# Patient Record
Sex: Male | Born: 2000 | Race: White | Hispanic: No | Marital: Single | Smoking: Current every day smoker
Health system: Southern US, Community
[De-identification: ages and names within clinical notes are randomized; demographics above are authoritative.]

---
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
## 2019-04-27 ENCOUNTER — Encounter: Payer: Self-pay | Admitting: Emergency Medicine

## 2019-04-27 ENCOUNTER — Emergency Department: Payer: 59

## 2019-04-27 ENCOUNTER — Other Ambulatory Visit: Payer: Self-pay

## 2019-04-27 ENCOUNTER — Observation Stay
Admission: EM | Admit: 2019-04-27 | Discharge: 2019-04-28 | Disposition: A | Discharge status: Home / Self Care | Payer: 59 | Attending: General Surgery | Admitting: General Surgery

## 2019-04-27 DIAGNOSIS — K561 Intussusception: Secondary | ICD-10-CM | POA: Diagnosis present

## 2019-04-27 DIAGNOSIS — Z880 Allergy status to penicillin: Secondary | ICD-10-CM | POA: Diagnosis not present

## 2019-04-27 DIAGNOSIS — Z20828 Contact with and (suspected) exposure to other viral communicable diseases: Secondary | ICD-10-CM | POA: Insufficient documentation

## 2019-04-27 DIAGNOSIS — Z7151 Drug abuse counseling and surveillance of drug abuser: Secondary | ICD-10-CM | POA: Diagnosis not present

## 2019-04-27 DIAGNOSIS — F17290 Nicotine dependence, other tobacco product, uncomplicated: Secondary | ICD-10-CM | POA: Insufficient documentation

## 2019-04-27 DIAGNOSIS — E876 Hypokalemia: Secondary | ICD-10-CM | POA: Insufficient documentation

## 2019-04-27 DIAGNOSIS — E860 Dehydration: Secondary | ICD-10-CM | POA: Diagnosis not present

## 2019-04-27 DIAGNOSIS — R112 Nausea with vomiting, unspecified: Secondary | ICD-10-CM | POA: Diagnosis not present

## 2019-04-27 DIAGNOSIS — F12188 Cannabis abuse with other cannabis-induced disorder: Secondary | ICD-10-CM | POA: Diagnosis not present

## 2019-04-27 DIAGNOSIS — R109 Unspecified abdominal pain: Secondary | ICD-10-CM | POA: Diagnosis present

## 2019-04-27 LAB — URINALYSIS, COMPLETE (UACMP) WITH MICROSCOPIC
Bacteria, UA: NONE SEEN
Bilirubin Urine: NEGATIVE
Glucose, UA: NEGATIVE mg/dL
Hgb urine dipstick: NEGATIVE
Ketones, ur: NEGATIVE mg/dL
Leukocytes,Ua: NEGATIVE
Nitrite: NEGATIVE
Protein, ur: 30 mg/dL — AB
Specific Gravity, Urine: 1.027 (ref 1.005–1.030)
pH: 7 (ref 5.0–8.0)

## 2019-04-27 LAB — LIPASE, BLOOD: Lipase: 17 U/L (ref 11–51)

## 2019-04-27 LAB — COMPREHENSIVE METABOLIC PANEL
ALT: 20 U/L (ref 0–44)
AST: 26 U/L (ref 15–41)
Albumin: 5.4 g/dL — ABNORMAL HIGH (ref 3.5–5.0)
Alkaline Phosphatase: 61 U/L (ref 38–126)
Anion gap: 16 — ABNORMAL HIGH (ref 5–15)
BUN: 21 mg/dL — ABNORMAL HIGH (ref 6–20)
CO2: 30 mmol/L (ref 22–32)
Calcium: 10.3 mg/dL (ref 8.9–10.3)
Chloride: 90 mmol/L — ABNORMAL LOW (ref 98–111)
Creatinine, Ser: 0.81 mg/dL (ref 0.61–1.24)
GFR calc Af Amer: >60 mL/min (ref >60)
GFR calc non Af Amer: >60 mL/min (ref >60)
Glucose, Bld: 128 mg/dL — ABNORMAL HIGH (ref 70–99)
Potassium: 2.9 mmol/L — ABNORMAL LOW (ref 3.5–5.1)
Sodium: 136 mmol/L (ref 135–145)
Total Bilirubin: 1.9 mg/dL — ABNORMAL HIGH (ref 0.3–1.2)
Total Protein: 8.9 g/dL — ABNORMAL HIGH (ref 6.5–8.1)

## 2019-04-27 LAB — URINE DRUG SCREEN, QUALITATIVE (ARMC ONLY)
Amphetamines, Ur Screen: NOT DETECTED
Barbiturates, Ur Screen: NOT DETECTED
Benzodiazepine, Ur Scrn: NOT DETECTED
Cannabinoid 50 Ng, Ur ~~LOC~~: POSITIVE — AB
Cocaine Metabolite,Ur ~~LOC~~: NOT DETECTED
MDMA (Ecstasy)Ur Screen: NOT DETECTED
Methadone Scn, Ur: NOT DETECTED
Opiate, Ur Screen: NOT DETECTED
Phencyclidine (PCP) Ur S: NOT DETECTED
Tricyclic, Ur Screen: NOT DETECTED

## 2019-04-27 LAB — CBC
HCT: 54.4 % — ABNORMAL HIGH (ref 39.0–52.0)
Hemoglobin: 19.4 g/dL — ABNORMAL HIGH (ref 13.0–17.0)
MCH: 29.3 pg (ref 26.0–34.0)
MCHC: 35.7 g/dL (ref 30.0–36.0)
MCV: 82.2 fL (ref 80.0–100.0)
Platelets: 334 10*3/uL (ref 150–400)
RBC: 6.62 MIL/uL — ABNORMAL HIGH (ref 4.22–5.81)
RDW: 11.6 % (ref 11.5–15.5)
WBC: 19.6 10*3/uL — ABNORMAL HIGH (ref 4.0–10.5)
nRBC: 0 % (ref 0.0–0.2)

## 2019-04-27 LAB — LACTIC ACID, PLASMA: Lactic Acid, Venous: 1.6 mmol/L (ref 0.5–1.9)

## 2019-04-27 LAB — MAGNESIUM: Magnesium: 2.4 mg/dL (ref 1.7–2.4)

## 2019-04-27 MED ORDER — SODIUM CHLORIDE 0.9 % IV BOLUS: 1000.0000 mL | Freq: Once | INTRAVENOUS | Status: DC

## 2019-04-27 MED ORDER — ONDANSETRON HCL 4 MG/2ML IJ SOLN
4.0000 mg | Freq: Four times a day (QID) | INTRAMUSCULAR | Status: DC | PRN
  Administered 2019-04-28 (×2): 4 mg via INTRAVENOUS
  Filled 2019-04-27 (×2): qty 2

## 2019-04-27 MED ORDER — HYDROMORPHONE HCL 1 MG/ML IJ SOLN: 0.5000 mg | INTRAMUSCULAR | Status: DC | PRN

## 2019-04-27 MED ORDER — SODIUM CHLORIDE 0.9 % IV BOLUS
1000.0000 mL | Freq: Once | INTRAVENOUS | Status: AC
  Administered 2019-04-27: 1000 mL via INTRAVENOUS

## 2019-04-27 MED ORDER — ONDANSETRON 4 MG PO TBDP
4.0000 mg | ORAL_TABLET | Freq: Four times a day (QID) | ORAL | Status: DC | PRN
  Filled 2019-04-27: qty 1

## 2019-04-27 MED ORDER — IOHEXOL 300 MG/ML  SOLN
100.0000 mL | Freq: Once | INTRAMUSCULAR | Status: AC | PRN
  Administered 2019-04-27: 100 mL via INTRAVENOUS
  Filled 2019-04-27: qty 100

## 2019-04-27 MED ORDER — KCL IN DEXTROSE-NACL 40-5-0.45 MEQ/L-%-% IV SOLN
INTRAVENOUS | Status: DC
  Administered 2019-04-27 – 2019-04-28 (×2): via INTRAVENOUS
  Filled 2019-04-27 (×4): qty 1000

## 2019-04-27 MED ORDER — ONDANSETRON HCL 4 MG/2ML IJ SOLN
4.0000 mg | Freq: Once | INTRAMUSCULAR | Status: AC | PRN
  Administered 2019-04-27: 4 mg via INTRAVENOUS
  Filled 2019-04-27: qty 2

## 2019-04-27 MED ORDER — IOHEXOL 9 MG/ML PO SOLN
500.0000 mL | ORAL | Status: AC
  Filled 2019-04-27 (×2): qty 500

## 2019-04-27 MED ORDER — POTASSIUM CHLORIDE 10 MEQ/100ML IV SOLN
10.0000 meq | INTRAVENOUS | Status: AC
  Filled 2019-04-27 (×2): qty 100

## 2019-04-27 MED ORDER — KETOROLAC TROMETHAMINE 30 MG/ML IJ SOLN
30.0000 mg | Freq: Four times a day (QID) | INTRAMUSCULAR | Status: DC | PRN
  Administered 2019-04-28: 10:00:00 30 mg via INTRAVENOUS
  Filled 2019-04-27: qty 1

## 2019-04-27 MED ORDER — SODIUM CHLORIDE 0.9% FLUSH: 3.0000 mL | Freq: Once | INTRAVENOUS | Status: DC

## 2019-04-27 NOTE — ED Notes (Signed)
See triage note  Presents with n/v for the past 3 days     Last time vomited was about 1 hours PTA  Denies any diarrhea or fever  Afebrile on arrival

## 2019-04-27 NOTE — ED Notes (Signed)
Pt ambulatory to restroom, gait steafdy

## 2019-04-27 NOTE — ED Triage Notes (Signed)
Pt states he waited too long to eat on Sunday  became nauseated and began throwing up, hasn't been able to keep anything down, sent here from UC for dehydration, has been seen here for the same about a year ago. NAD.

## 2019-04-27 NOTE — ED Provider Notes (Signed)
[facility] Emergency Department Provider Note  ____________________________________________  Time seen: Approximately 6:19 PM  I have reviewed the triage vital signs and the nursing notes.   HISTORY  Chief Complaint Emesis    HPI Eric Reid is a 18 y.o. male who presents the emergency department complaining of ongoing nausea and vomiting x3 days.  Patient reports that he did not eat much on Sunday, started feeling shaky and nauseated when he began to eat again nauseated began to throw up.  Patient reports his nausea and vomiting persisted and he has had limited intake of solids and liquids for the past 3 days.  Patient states that initially his only symptoms were nausea and vomiting but now includes diffuse abdominal pain which she describes as an ache from vomiting.  He also reports that his throat is now sore from emesis.  He did not have any fevers or chills, nasal congestion, sore throat, cough, abdominal pain prior to the onset of nausea and vomiting.  No diarrhea.  Patient is here requesting IV fluids and medications for nausea.  Patient has had no hematic emesis.  No constipation, urinary symptoms.         History reviewed. No pertinent past medical history.  Patient Active Problem List   Diagnosis Date Noted  . Abdominal pain 04/27/2019    History reviewed. No pertinent surgical history.  Prior to Admission medications   Medication Sig Start Date End Date Taking? Authorizing Provider  ondansetron (ZOFRAN ODT) 4 MG disintegrating tablet Take 1 tablet (4 mg total) by mouth every 8 (eight) hours as needed for nausea or vomiting. Patient not taking: Reported on 04/27/2019 09/13/18   Willy Eddyobinson, Patrick, MD  Poison Ivy Treatments (ZANFEL) MISC Use daily as needed. Patient not taking: Reported on 04/27/2019 09/25/14   Linna HoffKindl, James D, MD    Allergies Penicillins  No family history on file.  Social History Social History   Tobacco Use  .  Smoking status: Current Every Day Smoker  . Smokeless tobacco: Never Used  Substance Use Topics  . Alcohol use: No  . Drug use: Not on file     Review of Systems  Constitutional: No fever/chills Eyes: No visual changes. No discharge ENT: Reported sore throat from "the amount of vomiting." Cardiovascular: no chest pain. Respiratory: no cough. No SOB. Gastrointestinal: Diffuse abdominal pain described as aching from vomiting..  Ongoing nausea and emesis..  No diarrhea.  No constipation. Genitourinary: Negative for dysuria. No hematuria Musculoskeletal: Negative for musculoskeletal pain. Skin: Negative for rash, abrasions, lacerations, ecchymosis. Neurological: Negative for headaches, focal weakness or numbness. 10-point ROS otherwise negative.  ____________________________________________   PHYSICAL EXAM:  VITAL SIGNS: ED Triage Vitals  Enc Vitals Group     BP 04/27/19 1726 (!) 141/82     Pulse Rate 04/27/19 1726 99     Resp 04/27/19 1726 16     Temp 04/27/19 1726 98 F (36.7 C)     Temp Source 04/27/19 1726 Oral     SpO2 04/27/19 1726 100 %     Weight 04/27/19 1727 145 lb (65.8 kg)     Height 04/27/19 1727 5\' 10"  (1.778 m)     Head Circumference --      Peak Flow --      Pain Score 04/27/19 1726 7     Pain Loc --      Pain Edu? --      Excl. in GC? --      Constitutional: Alert and  oriented. Well appearing and in no acute distress. Eyes: Conjunctivae are normal. PERRL. EOMI. Head: Atraumatic. ENT:      Ears:       Nose: No congestion/rhinnorhea.      Mouth/Throat: Mucous membranes are moist.  Oropharynx is nonerythematous and nonedematous.  Uvula is midline. Neck: No stridor.  Neck is supple full range of motion Hematological/Lymphatic/Immunilogical: No cervical lymphadenopathy. Cardiovascular: Normal rate, regular rhythm. Normal S1 and S2.  Good peripheral circulation. Respiratory: Normal respiratory effort without tachypnea or retractions. Lungs CTAB. Good  air entry to the bases with no decreased or absent breath sounds. Gastrointestinal: No visible external abdominal wall findings.  Bowel sounds 4 quadrants.  Bowel sounds are slightly hyperactive throughout abdomen.. Soft and nontender to palpation. No guarding or rigidity. No palpable masses. No distention. No CVA tenderness. Musculoskeletal: Full range of motion to all extremities. No gross deformities appreciated. Neurologic:  Normal speech and language. No gross focal neurologic deficits are appreciated.  Skin:  Skin is warm, dry and intact. No rash noted. Psychiatric: Mood and affect are normal. Speech and behavior are normal. Patient exhibits appropriate insight and judgement.   ____________________________________________   LABS (all labs ordered are listed, but only abnormal results are displayed)  Labs Reviewed  COMPREHENSIVE METABOLIC PANEL - Abnormal; Notable for the following components:      Result Value   Potassium 2.9 (*)    Chloride 90 (*)    Glucose, Bld 128 (*)    BUN 21 (*)    Total Protein 8.9 (*)    Albumin 5.4 (*)    Total Bilirubin 1.9 (*)    Anion gap 16 (*)    All other components within normal limits  CBC - Abnormal; Notable for the following components:   WBC 19.6 (*)    RBC 6.62 (*)    Hemoglobin 19.4 (*)    HCT 54.4 (*)    All other components within normal limits  URINALYSIS, COMPLETE (UACMP) WITH MICROSCOPIC - Abnormal; Notable for the following components:   Color, Urine YELLOW (*)    APPearance HAZY (*)    Protein, ur 30 (*)    All other components within normal limits  URINE DRUG SCREEN, QUALITATIVE ([facility] ONLY) - Abnormal; Notable for the following components:   Cannabinoid 50 Ng, Ur [place] POSITIVE (*)    All other components within normal limits  SARS CORONAVIRUS 2 (TAT 6-24 HRS)  LIPASE, BLOOD  LACTIC ACID, PLASMA  MAGNESIUM  LACTIC ACID, PLASMA  HIV ANTIBODY (ROUTINE TESTING W REFLEX)  COMPREHENSIVE METABOLIC PANEL  MAGNESIUM   PHOSPHORUS  CBC  LACTIC ACID, PLASMA   ____________________________________________  EKG   ____________________________________________  RADIOLOGY I personally viewed and evaluated these images as part of my medical decision making, as well as reviewing the written report by the radiologist.  Ct Abdomen Pelvis W Contrast  Result Date: 04/27/2019 CLINICAL DATA:  Acute abdominal pain EXAM: CT ABDOMEN AND PELVIS WITH CONTRAST TECHNIQUE: Multidetector CT imaging of the abdomen and pelvis was performed using the standard protocol following bolus administration of intravenous contrast. CONTRAST:  147mL OMNIPAQUE IOHEXOL 300 MG/ML  SOLN COMPARISON:  None. FINDINGS: Lower chest: No acute abnormality. Hepatobiliary: No focal liver abnormality is seen. No gallstones, gallbladder wall thickening, or biliary dilatation. Pancreas: Unremarkable. No pancreatic ductal dilatation or surrounding inflammatory changes. Spleen: Normal in size without focal abnormality. Adrenals/Urinary Tract: Adrenal glands are unremarkable. Kidneys are normal, without renal calculi, focal lesion, or hydronephrosis. Bladder is unremarkable. Stomach/Bowel: Contrast is noted  within the colon which is predominately decompressed. The appendix is within normal limits. Small bowel shows a nonobstructive pattern. There is evidence of partial intussusception of one of the jejunal loops in the left mid abdomen. This is best seen on image number 48 of series 2 and image number 25 of series 5. This is likely a transient abnormality. No proximal obstructive changes are seen. The stomach is within normal limits. Vascular/Lymphatic: No significant vascular findings are present. No enlarged abdominal or pelvic lymph nodes. Reproductive: Prostate is unremarkable. Other: No abdominal wall hernia or abnormality. No abdominopelvic ascites. Musculoskeletal: No acute or significant osseous findings. IMPRESSION: Changes consistent with likely transient  intussusception in one of the jejunal loops in the left mid abdomen. No proximal obstructive changes are seen. No other focal abnormality is noted. Electronically Signed   By: Alcide Clever M.D.   On: 04/27/2019 21:45    ____________________________________________    PROCEDURES  Procedure(s) performed:    .Critical Care Performed by: Racheal Patches, PA-C Authorized by: Racheal Patches, PA-C   Critical care provider statement:    Critical care time (minutes):  40   Critical care time was exclusive of:  Separately billable procedures and treating other patients and teaching time   Critical care was necessary to treat or prevent imminent or life-threatening deterioration of the following conditions:  Dehydration   Critical care was time spent personally by me on the following activities:  Development of treatment plan with patient or surrogate, evaluation of patient's response to treatment, examination of patient, ordering and performing treatments and interventions, ordering and review of laboratory studies, ordering and review of radiographic studies, re-evaluation of patient's condition and discussions with consultants      Medications  sodium chloride flush (NS) 0.9 % injection 3 mL (has no administration in time range)  iohexol (OMNIPAQUE) 9 MG/ML oral solution 500 mL (has no administration in time range)  potassium chloride 10 mEq in 100 mL IVPB (has no administration in time range)  sodium chloride 0.9 % bolus 1,000 mL (has no administration in time range)  dextrose 5 % and 0.45 % NaCl with KCl 40 mEq/L infusion (has no administration in time range)  ketorolac (TORADOL) 30 MG/ML injection 30 mg (has no administration in time range)  HYDROmorphone (DILAUDID) injection 0.5 mg (has no administration in time range)  ondansetron (ZOFRAN-ODT) disintegrating tablet 4 mg (has no administration in time range)    Or  ondansetron (ZOFRAN) injection 4 mg (has no  administration in time range)  ondansetron (ZOFRAN) injection 4 mg (4 mg Intravenous Given 04/27/19 1734)  sodium chloride 0.9 % bolus 1,000 mL (0 mLs Intravenous Stopped 04/27/19 1824)  sodium chloride 0.9 % bolus 1,000 mL (1,000 mLs Intravenous New Bag/Given 04/27/19 1915)  iohexol (OMNIPAQUE) 300 MG/ML solution 100 mL (100 mLs Intravenous Contrast Given 04/27/19 2118)     ____________________________________________   INITIAL IMPRESSION / ASSESSMENT AND PLAN / ED COURSE  Pertinent labs & imaging results that were available during my care of the patient were reviewed by me and considered in my medical decision making (see chart for details).  Review of the  [place]RS was performed in accordance of the [facility] prior to dispensing any controlled drugs.           Patient's diagnosis is consistent with intussusception.  Patient presented to the emergency department with 3 days of sudden onset nausea and vomiting.  Patient has had no fevers or chills, nasal congestion, sore throat, cough.  No diarrhea or constipation.  Patient had diffuse abdominal pain.  Initial labs revealed elevated white blood cell count, findings consistent with dehydration.  Patient was given IV fluids and had a CT scan of the abdomen.  This resulted with findings consistent with a partial intussusception.  I discussed the case with GI and GI was concerned for underlying structural abnormality that could cause an intussusception.  Given its location they stated that general surgery would have to perform further evaluation.  I contacted general surgery who agrees to admit the patient.  They will reevaluate the patient in the morning to determine patient's status and whether the patient will need surgical intervention or not. Patient is given ED precautions to return to the ED for any worsening or new symptoms.     ____________________________________________  FINAL CLINICAL IMPRESSION(S) / ED DIAGNOSES  Final diagnoses:   Intussusception of jejunum (HCC)      NEW MEDICATIONS STARTED DURING THIS VISIT:  ED Discharge Orders    None          This chart was dictated using voice recognition software/Dragon. Despite best efforts to proofread, errors can occur which can change the meaning. Any change was purely unintentional.    Racheal Patches, PA-C 04/27/19 2314    Arnaldo Natal, MD 04/27/19 [phone]

## 2019-04-27 NOTE — ED Notes (Signed)
Pt talking on cell phone  Mother with pt. Iv fluids infusing.

## 2019-04-27 NOTE — Plan of Care (Signed)
18 y/o M presenting to the ED with 3 day history of N/V and abdominal pain.  Improving in ED with fluid resuscitation. Has leukocytosis and labs consistent with dehydration.  Lactic acid is normal.  While CT scan suggests possible intussusception, the duration of symptoms and normal LA, along with clinical improvement in ED do not support this diagnosis.  Normal peristalsis can have a similar appearance on imaging. Suspect gastroenteritis, but in light of CT findings, will admit for serial abdominal exams, clinical monitoring, repletion of electrolytes, rehydration, and pain control.  Will have low threshold for repeat imaging. Full H&P to follow.

## 2019-04-28 DIAGNOSIS — F12188 Cannabis abuse with other cannabis-induced disorder: Secondary | ICD-10-CM

## 2019-04-28 DIAGNOSIS — R112 Nausea with vomiting, unspecified: Secondary | ICD-10-CM | POA: Diagnosis not present

## 2019-04-28 LAB — COMPREHENSIVE METABOLIC PANEL
ALT: 15 U/L (ref 0–44)
AST: 14 U/L — ABNORMAL LOW (ref 15–41)
Albumin: 4.4 g/dL (ref 3.5–5.0)
Alkaline Phosphatase: 48 U/L (ref 38–126)
Anion gap: 11 (ref 5–15)
BUN: 14 mg/dL (ref 6–20)
CO2: 29 mmol/L (ref 22–32)
Calcium: 9.1 mg/dL (ref 8.9–10.3)
Chloride: 98 mmol/L (ref 98–111)
Creatinine, Ser: 0.87 mg/dL (ref 0.61–1.24)
GFR calc Af Amer: >60 mL/min (ref >60)
GFR calc non Af Amer: >60 mL/min (ref >60)
Glucose, Bld: 120 mg/dL — ABNORMAL HIGH (ref 70–99)
Potassium: 3.3 mmol/L — ABNORMAL LOW (ref 3.5–5.1)
Sodium: 138 mmol/L (ref 135–145)
Total Bilirubin: 1.9 mg/dL — ABNORMAL HIGH (ref 0.3–1.2)
Total Protein: 7.2 g/dL (ref 6.5–8.1)

## 2019-04-28 LAB — SARS CORONAVIRUS 2 (TAT 6-24 HRS): SARS Coronavirus 2: NEGATIVE

## 2019-04-28 LAB — CBC
HCT: 46.6 % (ref 39.0–52.0)
Hemoglobin: 16.4 g/dL (ref 13.0–17.0)
MCH: 29.4 pg (ref 26.0–34.0)
MCHC: 35.2 g/dL (ref 30.0–36.0)
MCV: 83.5 fL (ref 80.0–100.0)
Platelets: 263 10*3/uL (ref 150–400)
RBC: 5.58 MIL/uL (ref 4.22–5.81)
RDW: 11.8 % (ref 11.5–15.5)
WBC: 12.8 10*3/uL — ABNORMAL HIGH (ref 4.0–10.5)
nRBC: 0 % (ref 0.0–0.2)

## 2019-04-28 LAB — MAGNESIUM: Magnesium: 2.2 mg/dL (ref 1.7–2.4)

## 2019-04-28 LAB — LACTIC ACID, PLASMA: Lactic Acid, Venous: 1.1 mmol/L (ref 0.5–1.9)

## 2019-04-28 LAB — PHOSPHORUS: Phosphorus: 3 mg/dL (ref 2.5–4.6)

## 2019-04-28 LAB — HIV ANTIBODY (ROUTINE TESTING W REFLEX): HIV Screen 4th Generation wRfx: NONREACTIVE

## 2019-04-28 MED ORDER — ONDANSETRON 4 MG PO TBDP: 4.0000 mg | ORAL_TABLET | Freq: Four times a day (QID) | ORAL | 0 refills | Status: DC | PRN

## 2019-04-28 MED ORDER — PROMETHAZINE HCL 25 MG/ML IJ SOLN
12.5000 mg | INTRAMUSCULAR | Status: DC | PRN
  Administered 2019-04-28: 07:00:00 12.5 mg via INTRAVENOUS
  Filled 2019-04-28: qty 1

## 2019-04-28 MED ORDER — PROMETHAZINE HCL 12.5 MG PO TABS: 12.5000 mg | ORAL_TABLET | Freq: Four times a day (QID) | ORAL | 0 refills | Status: DC | PRN

## 2019-04-28 NOTE — ED Notes (Signed)
HIPAA password established with patient's mother.

## 2019-04-28 NOTE — ED Notes (Signed)
Pt has nausea/vomiting   meds given.   Mother with pt

## 2019-04-28 NOTE — ED Notes (Signed)
This RN notified Brandy, 1C receiving RN that [facility] issues had been addressed with admitting provider, per [place], pt okay to come up. This RN reviewed visitor policy with Theadora Rama, RN, per Theadora Rama, pt is A&O x 4, pt's mom who is at bedside cannot come to floor with him, pt states to this RN he is not going up to floor without his mom.

## 2019-04-28 NOTE — ED Notes (Signed)
Report off to megan rn. 

## 2019-04-28 NOTE — ED Notes (Signed)
Pt sleeping   Mother with pt   Pt waiting on admission bed.

## 2019-04-28 NOTE — Progress Notes (Signed)
Patient discharged home per MD order. All discharge instructions given and all questions answered. 

## 2019-04-28 NOTE — ED Notes (Signed)
This RN messaged admitting MD regarding concerns from receiving floor RN regarding MAR prior to patient coming to floor. Awaiting orders from Dr. Celine Ahr at this time, will transport patient to floor after addressing concerns.

## 2019-04-28 NOTE — ED Notes (Signed)
Pt ambulatory to the bathroom at this time, NAD noted, ambulatory with steady gait at this time.

## 2019-04-28 NOTE — ED Notes (Signed)
This RN to bedside, apologized for and explained visiting policy to patient and mother, this RN explained that patient could have a visitor at [place] pt's mother states "well then tell them that Caleb will be up there at 10am." This RN explained that due to patient having an assigned bed pt could not continue to hold down in ER so that she could stay with him and that per Theadora Rama, 1C RN patient's mother would not be allowed to go with patient. Pt's mother states "I think I'll take my chances with Brandy". This RN explained that patient's mother would not be allowed to follow this RN and patient to the floor given that he was over 69. Pt's mother states "what are they going to do? I understand you have a job to do but what are you going to do about it?" This RN notified charge RN of potential issue, charge RN in [place] at this time to speak with mother.

## 2019-04-28 NOTE — H&P (Addendum)
[facility] SURGICAL HISTORY & PHYSICAL (cpt [phone])  HISTORY OF PRESENT ILLNESS (HPI):  18 y.o. male presented to [facility] ED yesterday (04/27/2019) for nausea and emesis. Patient reports that around Sunday afternoon he began to feel shaky, nauseous, and started to have multiple episodes of emesis. This persisted over the last 3 days. Only PO intake was limited amounts of liquids. He did endorse diffuse, crampy abdominal pain in the ED, but this has improved significantly. No fever, chills, cough, congestion, CP, SOB, urinary or bowel changes. Of not he does endorse vaping and smokes marijuana "everyday, multiple times a day, for years." Had a similar episode in March. No previous abdominal surgeries. In the ED he was found to be hypokalemic (2.9), slight increase in BUN (21), WBC (19) and Hgb (19) both likely hemoconcentrated from dehydration, normal lactic acid, and on CT there was concern for "Transient Intussusception."  General surgery is consulted by emergency medicine provider Jonathon Cuthriell, PA-C for evaluation and management of possible intussusception    PAST MEDICAL HISTORY (PMH):  History reviewed. No pertinent past medical history.  Reviewed. Otherwise negative.   PAST SURGICAL HISTORY (PSH):  History reviewed. No pertinent surgical history.  Reviewed. Otherwise negative.   MEDICATIONS:  Prior to Admission medications   Medication Sig Start Date End Date Taking? Authorizing Provider  ondansetron (ZOFRAN ODT) 4 MG disintegrating tablet Take 1 tablet (4 mg total) by mouth every 8 (eight) hours as needed for nausea or vomiting. Patient not taking: Reported on 04/27/2019 09/13/18   Willy Eddyobinson, Patrick, MD  Poison Ivy Treatments (ZANFEL) MISC Use daily as needed. Patient not taking: Reported on 04/27/2019 09/25/14   Linna HoffKindl, James D, MD     ALLERGIES:  Allergies  Allergen Reactions  . Penicillins      SOCIAL HISTORY:  Social History   Socioeconomic History  .  Marital status: Single    Spouse name: Not on file  . Number of children: Not on file  . Years of education: Not on file  . Highest education level: Not on file  Occupational History  . Not on file  Social Needs  . Financial resource strain: Not on file  . Food insecurity    Worry: Not on file    Inability: Not on file  . Transportation needs    Medical: Not on file    Non-medical: Not on file  Tobacco Use  . Smoking status: Current Every Day Smoker  . Smokeless tobacco: Never Used  Substance and Sexual Activity  . Alcohol use: No  . Drug use: Not on file  . Sexual activity: Not on file  Lifestyle  . Physical activity    Days per week: Not on file    Minutes per session: Not on file  . Stress: Not on file  Relationships  . Social Musicianconnections    Talks on phone: Not on file    Gets together: Not on file    Attends religious service: Not on file    Active member of club or organization: Not on file    Attends meetings of clubs or organizations: Not on file    Relationship status: Not on file  . Intimate partner violence    Fear of current or ex partner: Not on file    Emotionally abused: Not on file    Physically abused: Not on file    Forced sexual activity: Not on file  Other Topics Concern  . Not on file  Social History Narrative  .  Not on file     FAMILY HISTORY:  No family history on file.  Otherwise negative.   REVIEW OF SYSTEMS:  Review of Systems  Constitutional: Negative for chills and fever.  HENT: Negative for congestion and sore throat.   Respiratory: Negative for cough and shortness of breath.   Cardiovascular: Negative for chest pain and palpitations.  Gastrointestinal: Positive for abdominal pain, nausea and vomiting. Negative for blood in stool, constipation and diarrhea.  Genitourinary: Negative for dysuria.  All other systems reviewed and are negative.   VITAL SIGNS:  Temp:  [98 F (36.7 C)-98.5 F (36.9 C)] 98.5 F (36.9 C) (10/28  0526) Pulse Rate:  [54-99] 54 (10/28 0526) Resp:  [14-16] 14 (10/28 0418) BP: (129-167)/(78-93) 129/78 (10/28 0526) SpO2:  [97 %-100 %] 97 % (10/28 0526) Weight:  [65.8 kg] 65.8 kg (10/27 1727)     Height: 5\' 10"  (177.8 cm) Weight: 65.8 kg BMI (Calculated): 20.81   PHYSICAL EXAM:  Physical Exam Vitals signs and nursing note reviewed.  Constitutional:      General: He is not in acute distress.    Appearance: Normal appearance. He is not ill-appearing.     Comments: Actively vomiting  HENT:     Head: Normocephalic and atraumatic.  Eyes:     General: No scleral icterus.    Conjunctiva/sclera: Conjunctivae normal.  Cardiovascular:     Rate and Rhythm: Normal rate and regular rhythm.     Pulses: Normal pulses.     Heart sounds: No murmur. No friction rub. No gallop.   Pulmonary:     Effort: Pulmonary effort is normal. No respiratory distress.     Breath sounds: Normal breath sounds. No wheezing or rhonchi.  Abdominal:     General: Abdomen is flat. There is no distension.     Palpations: Abdomen is soft.     Tenderness: There is no abdominal tenderness. There is no guarding or rebound.  Genitourinary:    Comments: Deferred Musculoskeletal:     Right lower leg: No edema.     Left lower leg: No edema.  Skin:    General: Skin is warm and dry.  Neurological:     General: No focal deficit present.     Mental Status: He is alert. He is disoriented.  Psychiatric:        Mood and Affect: Mood normal.        Behavior: Behavior normal.     INTAKE/OUTPUT:  This shift: No intake/output data recorded.  Last 2 shifts: @IOLAST2SHIFTS @  Labs:  CBC Latest Ref Rng & Units 04/28/2019 04/27/2019 09/13/2018  WBC 4.0 - 10.5 K/uL 12.8(H) 19.6(H) 9.2  Hemoglobin 13.0 - 17.0 g/dL 04/29/2019 19.4(H) 16.0  Hematocrit 39.0 - 52.0 % 46.6 54.4(H) 47.0  Platelets 150 - 400 K/uL 263 334 376   CMP Latest Ref Rng & Units 04/28/2019 04/27/2019 09/13/2018  Glucose 70 - 99 mg/dL 04/29/2019) 09/15/2018) 283(M)  BUN 6 -  20 mg/dL 14 629(U) 14  Creatinine 0.61 - 1.24 mg/dL 765(Y 65(K 3.54  Sodium 135 - 145 mmol/L 138 136 140  Potassium 3.5 - 5.1 mmol/L 3.3(L) 2.9(L) 3.8  Chloride 98 - 111 mmol/L 98 90(L) 101  CO2 22 - 32 mmol/L 29 30 28   Calcium 8.9 - 10.3 mg/dL 9.1 6.56 9.7  Total Protein 6.5 - 8.1 g/dL 7.2 8.9(H) 8.4(H)  Total Bilirubin 0.3 - 1.2 mg/dL 8.12) 1.9(H) 1.2  Alkaline Phos 38 - 126 U/L 48 61 48  AST 15 - 41  U/L 14(L) 26 17  ALT 0 - 44 U/L 15 20 16      Imaging studies:   CT Abdomen/Pelvis (04/27/2019) personally reviewed which shows very short segment of intussusception, most likely peristalsis, and radiologist report reviewed below:  IMPRESSION: Changes consistent with likely transient intussusception in one of the jejunal loops in the left mid abdomen. No proximal obstructive changes are seen.  No other focal abnormality is noted.   Assessment/Plan: (ICD-10's: R11.2) 18 y.o. 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 Although there is a read of intussusception on imaging, given that it is a short segment and he has clinically improved this most likely represents peristalsis.    - Okay for clear liquids as tolerates; ADAT once nausea/emesis improve   - Continue IVF + KCL repletion; monitor electrolytes  - antiemetics prn  - monitor abdominal examination  - No indication for surgical intervention  - discussed cessation of cannabis use as this may be contributing to his presentation    - mobilization encouraged   All of the above findings and recommendations were discussed with the patient, and all of his questions were answered to his expressed satisfaction.  -- [name], PA-C [facility] 04/28/2019, 7:22 AM [phone] M-F: 7am - 4pm  I saw and evaluated the patient.  I agree with the above documentation,  exam, and plan, which I have edited where appropriate. Fredirick Maudlin  3:21 PM

## 2019-04-28 NOTE — Discharge Summary (Addendum)
[facility] SURGICAL DISCHARGE SUMMARY (cpt: [phone])  Patient ID: Eric Reid MRN: [id number] DOB/AGE: 01-18-2001 18 y.o.  Admit date: 04/27/2019 Discharge date: 04/28/2019  Discharge Diagnoses Cannabis Abuse Disorder - Hyperemesis (ICD-10s: F12-188)  Consultants None  Procedures None  HPI: 18 y.o. male presented to [facility] ED yesterday (04/27/2019) for nausea and emesis. Patient reports that around Sunday afternoon he began to feel shaky, nauseous, and started to have multiple episodes of emesis. This persisted over the last 3 days. Only PO intake was limited amounts of liquids. He did endorse diffuse, crampy abdominal pain in the ED, but this has improved significantly. No fever, chills, cough, congestion, CP, SOB, urinary or bowel changes. Of note he does endorse vaping and smokes marijuana "everyday, multiple times a day, for years."  He had a prior similar episode in March. No previous abdominal surgeries. In the ED he was found to be hypokalemic (2.9), slight increase in BUN (21), WBC (19) and Hgb (19) both likely hemoconcentrated from dehydration, normal lactic acid, and on CT there was concern for "Transient Intussusception."  Hospital Course: Patient was admitted to general surgery over concerns of intussusception however his abdominal pain improved/resolved. His electrolyte derangements were corrected with hydration and repletion. His nausea and emesis improved and he started a CLD. He wished to go home, which is reasonable and we will send him home with antiemetics. He was strongly encouraged and counseled on marijuana abuse and cessation especially as this seems to be the more likely etiology of his symptoms. He reports that he understands.   Discharge Condition: Good    Allergies as of 04/28/2019      Reactions   Penicillins       Medication List    STOP taking these medications   Zanfel Misc     TAKE these medications   ondansetron 4 MG  disintegrating tablet Commonly known as: ZOFRAN-ODT Take 1 tablet (4 mg total) by mouth every 6 (six) hours as needed for nausea. What changed:   when to take this  reasons to take this   promethazine 12.5 MG tablet Commonly known as: PHENERGAN Take 1 tablet (12.5 mg total) by mouth every 6 (six) hours as needed for nausea or vomiting.        Follow-up Information    [facility] Follow up.   Why: AS NEEDED, DOES NOT NEED A FOLLOW UP Contact information: [place] [id number] [phone]       [facility] Follow up.   Specialty: General Surgery Why: DOES NOT NEED FOLLOW UP, TO PROVIDE INFORMATION ONLY Contact information: [address] [place] [phone]           Time spent on discharge management including discussion of hospital course, clinical condition, outpatient instructions, prescriptions, and follow up with the patient and members of the medical team: >30 minutes  -- [name] , PA-C [facility]  04/28/2019, 1:54 PM [phone] M-F: 7am - 4pm   I saw and evaluated the patient.  I agree with the above documentation, exam, and plan, which I have edited where appropriate. Fredirick Maudlin  3:20 PM

## 2019-07-12 ENCOUNTER — Other Ambulatory Visit: Payer: Self-pay

## 2019-07-12 ENCOUNTER — Emergency Department
Admission: EM | Admit: 2019-07-12 | Discharge: 2019-07-12 | Disposition: A | Discharge status: Home / Self Care | Payer: Managed Care, Other (non HMO) | Attending: Emergency Medicine | Admitting: Emergency Medicine

## 2019-07-12 ENCOUNTER — Encounter: Payer: Self-pay | Admitting: Emergency Medicine

## 2019-07-12 DIAGNOSIS — F12188 Cannabis abuse with other cannabis-induced disorder: Secondary | ICD-10-CM

## 2019-07-12 DIAGNOSIS — F17210 Nicotine dependence, cigarettes, uncomplicated: Secondary | ICD-10-CM | POA: Diagnosis not present

## 2019-07-12 DIAGNOSIS — T407X1A Poisoning by cannabis (derivatives), accidental (unintentional), initial encounter: Secondary | ICD-10-CM | POA: Insufficient documentation

## 2019-07-12 DIAGNOSIS — R112 Nausea with vomiting, unspecified: Secondary | ICD-10-CM | POA: Diagnosis present

## 2019-07-12 LAB — URINALYSIS, COMPLETE (UACMP) WITH MICROSCOPIC
Bacteria, UA: NONE SEEN
Bilirubin Urine: NEGATIVE
Glucose, UA: NEGATIVE mg/dL
Hgb urine dipstick: NEGATIVE
Ketones, ur: 5 mg/dL — AB
Leukocytes,Ua: NEGATIVE
Nitrite: NEGATIVE
Protein, ur: 100 mg/dL — AB
Specific Gravity, Urine: 1.029 (ref 1.005–1.030)
pH: 6 (ref 5.0–8.0)

## 2019-07-12 LAB — COMPREHENSIVE METABOLIC PANEL
ALT: 17 U/L (ref 0–44)
AST: 27 U/L (ref 15–41)
Albumin: 5.2 g/dL — ABNORMAL HIGH (ref 3.5–5.0)
Alkaline Phosphatase: 60 U/L (ref 38–126)
Anion gap: 18 — ABNORMAL HIGH (ref 5–15)
BUN: 22 mg/dL — ABNORMAL HIGH (ref 6–20)
CO2: 28 mmol/L (ref 22–32)
Calcium: 9.9 mg/dL (ref 8.9–10.3)
Chloride: 90 mmol/L — ABNORMAL LOW (ref 98–111)
Creatinine, Ser: 1.09 mg/dL (ref 0.61–1.24)
GFR calc Af Amer: >60 mL/min (ref >60)
GFR calc non Af Amer: >60 mL/min (ref >60)
Glucose, Bld: 168 mg/dL — ABNORMAL HIGH (ref 70–99)
Potassium: 2.6 mmol/L — CL (ref 3.5–5.1)
Sodium: 136 mmol/L (ref 135–145)
Total Bilirubin: 2.6 mg/dL — ABNORMAL HIGH (ref 0.3–1.2)
Total Protein: 8.7 g/dL — ABNORMAL HIGH (ref 6.5–8.1)

## 2019-07-12 LAB — CBC
HCT: 51.6 % (ref 39.0–52.0)
Hemoglobin: 18.5 g/dL — ABNORMAL HIGH (ref 13.0–17.0)
MCH: 29.1 pg (ref 26.0–34.0)
MCHC: 35.9 g/dL (ref 30.0–36.0)
MCV: 81.3 fL (ref 80.0–100.0)
Platelets: 392 10*3/uL (ref 150–400)
RBC: 6.35 MIL/uL — ABNORMAL HIGH (ref 4.22–5.81)
RDW: 11.9 % (ref 11.5–15.5)
WBC: 18.7 10*3/uL — ABNORMAL HIGH (ref 4.0–10.5)
nRBC: 0 % (ref 0.0–0.2)

## 2019-07-12 LAB — LIPASE, BLOOD: Lipase: 22 U/L (ref 11–51)

## 2019-07-12 MED ORDER — POTASSIUM CHLORIDE CRYS ER 20 MEQ PO TBCR
40.0000 meq | EXTENDED_RELEASE_TABLET | Freq: Once | ORAL | Status: AC
  Administered 2019-07-12: 40 meq via ORAL
  Filled 2019-07-12: qty 2

## 2019-07-12 MED ORDER — DROPERIDOL 2.5 MG/ML IJ SOLN
1.2500 mg | Freq: Once | INTRAMUSCULAR | Status: AC
  Administered 2019-07-12: 1.25 mg via INTRAVENOUS
  Filled 2019-07-12: qty 2

## 2019-07-12 MED ORDER — SODIUM CHLORIDE 0.9% FLUSH
3.0000 mL | Freq: Once | INTRAVENOUS | Status: AC
  Administered 2019-07-12: 3 mL via INTRAVENOUS

## 2019-07-12 MED ORDER — SODIUM CHLORIDE 0.9 % IV SOLN
1000.0000 mL | Freq: Once | INTRAVENOUS | Status: AC
  Administered 2019-07-12: 1000 mL via INTRAVENOUS

## 2019-07-12 NOTE — ED Triage Notes (Addendum)
States has had nausea and vomiting. States has had the same symptoms in the past but no recent workup. When questioned if keeping oral fluids in states "some". States taking was prescribed zofran and phenergan and taking without relief.

## 2019-07-12 NOTE — ED Notes (Signed)
Pt reports abd pain and NV for the last 2 days. Pt denies recent exposures, fevers, or travel.

## 2019-07-12 NOTE — ED Provider Notes (Signed)
[facility] Emergency Department Provider Note   ____________________________________________    I have reviewed the triage vital signs and the nursing notes.   HISTORY  Chief Complaint Emesis and Abdominal Pain     HPI Eric Reid is a 19 y.o. male who presents with complaints of nausea vomiting abdominal cramping.  Patient reports this has been ongoing for 2 days.  He has been using Zofran and Phenergan without relief.  He does admit to smoking marijuana prior to this starting.  He reports this is happened several times in the past as well.  Denies fevers or chills.  No diarrhea.  Does get better when he takes a hot shower.  History reviewed. No pertinent past medical history.  Patient Active Problem List   Diagnosis Date Noted  . Abdominal pain 04/27/2019    History reviewed. No pertinent surgical history.  Prior to Admission medications   Not on File     Allergies Penicillins  No family history on file.  Social History Social History   Tobacco Use  . Smoking status: Current Every Day Smoker  . Smokeless tobacco: Never Used  Substance Use Topics  . Alcohol use: No  . Drug use: Not on file    Review of Systems  Constitutional: No fever/chills Eyes: No visual changes.  ENT: No sore throat. Cardiovascular: Denies chest pain. Respiratory: Denies shortness of breath. Gastrointestinal: As above Genitourinary: Negative for dysuria. Musculoskeletal: Negative for back pain. Skin: Negative for rash. Neurological: Negative for headaches    ____________________________________________   PHYSICAL EXAM:  VITAL SIGNS: ED Triage Vitals  Enc Vitals Group     BP 07/12/19 1220 (!) 135/107     Pulse Rate 07/12/19 1220 (!) 103     Resp 07/12/19 1220 20     Temp 07/12/19 1220 98.7 F (37.1 C)     Temp Source 07/12/19 1220 Oral     SpO2 07/12/19 1220 98 %     Weight 07/12/19 1221 68 kg (150 lb)     Height 07/12/19 1221 1.753  m (5\' 9" )     Head Circumference --      Peak Flow --      Pain Score 07/12/19 1221 10     Pain Loc --      Pain Edu? --      Excl. in [place]? --     Constitutional: Alert and oriented.  Nose: No congestion/rhinnorhea. Mouth/Throat: Mucous membranes are moist.    Cardiovascular: Normal rate, regular rhythm.  Good peripheral circulation. Respiratory: Normal respiratory effort.  No retractions Gastrointestinal: Soft and nontender. No distention.  No CVA tenderness.  Reassuring exam Genitourinary: deferred Musculoskeletal: No lower extremity tenderness nor edema.  Warm and well perfused Neurologic:  Normal speech and language. No gross focal neurologic deficits are appreciated.  Skin:  Skin is warm, dry and intact. No rash noted. Psychiatric: Mood and affect are normal. Speech and behavior are normal.  ____________________________________________   LABS (all labs ordered are listed, but only abnormal results are displayed)  Labs Reviewed  COMPREHENSIVE METABOLIC PANEL - Abnormal; Notable for the following components:      Result Value   Potassium 2.6 (*)    Chloride 90 (*)    Glucose, Bld 168 (*)    BUN 22 (*)    Total Protein 8.7 (*)    Albumin 5.2 (*)    Total Bilirubin 2.6 (*)    Anion gap 18 (*)    All other  components within normal limits  CBC - Abnormal; Notable for the following components:   WBC 18.7 (*)    RBC 6.35 (*)    Hemoglobin 18.5 (*)    All other components within normal limits  URINALYSIS, COMPLETE (UACMP) WITH MICROSCOPIC - Abnormal; Notable for the following components:   Color, Urine YELLOW (*)    APPearance CLEAR (*)    Ketones, ur 5 (*)    Protein, ur 100 (*)    All other components within normal limits  LIPASE, BLOOD   ____________________________________________  EKG  None ____________________________________________  RADIOLOGY  None ____________________________________________   PROCEDURES  Procedure(s) performed:  No  Procedures   Critical Care performed: No ____________________________________________   INITIAL IMPRESSION / ASSESSMENT AND PLAN / ED COURSE  Pertinent labs & imaging results that were available during my care of the patient were reviewed by me and considered in my medical decision making (see chart for details).  Patient presents with nausea vomiting mild abdominal cramping.  Lab work significant for elevated white blood cell count however abdominal exam is quite reassuring.  History most consistent with cyclical vomiting, likely cannabis hyperemesis given onset.  We will treat with IV droperidol, IV fluids and reevaluate.  Counseled patient on marijuana cessation  Lab work demonstrates mild hypokalemia, repleted.  Patient feeling better after IV droperidol, and fluids, appropriate for discharge at this time    ____________________________________________   FINAL CLINICAL IMPRESSION(S) / ED DIAGNOSES  Final diagnoses:  Cannabis hyperemesis syndrome concurrent with and due to cannabis abuse [facility])        Note:  This document was prepared using Dragon voice recognition software and may include unintentional dictation errors.   Jene Every, MD 07/12/19 1450

## 2019-07-12 NOTE — ED Notes (Signed)
Pt verbalizes understanding of d/c instructions, medications and follow up 

## 2021-09-27 DIAGNOSIS — Z659 Problem related to unspecified psychosocial circumstances: Secondary | ICD-10-CM | POA: Diagnosis not present

## 2021-09-27 DIAGNOSIS — F1120 Opioid dependence, uncomplicated: Secondary | ICD-10-CM | POA: Diagnosis not present

## 2021-09-27 DIAGNOSIS — F1123 Opioid dependence with withdrawal: Secondary | ICD-10-CM | POA: Diagnosis not present

## 2021-12-07 DIAGNOSIS — F1120 Opioid dependence, uncomplicated: Secondary | ICD-10-CM | POA: Diagnosis not present

## 2021-12-07 DIAGNOSIS — F1220 Cannabis dependence, uncomplicated: Secondary | ICD-10-CM | POA: Diagnosis not present

## 2022-02-21 DIAGNOSIS — F1120 Opioid dependence, uncomplicated: Secondary | ICD-10-CM | POA: Diagnosis not present

## 2022-02-26 DIAGNOSIS — F1120 Opioid dependence, uncomplicated: Secondary | ICD-10-CM | POA: Diagnosis not present

## 2022-08-12 DIAGNOSIS — F418 Other specified anxiety disorders: Secondary | ICD-10-CM | POA: Diagnosis not present

## 2022-08-12 DIAGNOSIS — K4090 Unilateral inguinal hernia, without obstruction or gangrene, not specified as recurrent: Secondary | ICD-10-CM | POA: Diagnosis not present

## 2023-12-07 DIAGNOSIS — F1120 Opioid dependence, uncomplicated: Secondary | ICD-10-CM | POA: Diagnosis not present

## 2023-12-13 ENCOUNTER — Other Ambulatory Visit: Payer: Self-pay

## 2023-12-13 ENCOUNTER — Emergency Department (HOSPITAL_COMMUNITY)
Admission: EM | Admit: 2023-12-13 | Discharge: 2023-12-13 | Discharge status: Against Medical Advice | Attending: Emergency Medicine | Admitting: Emergency Medicine

## 2023-12-13 ENCOUNTER — Encounter (HOSPITAL_COMMUNITY): Payer: Self-pay

## 2023-12-13 DIAGNOSIS — M25561 Pain in right knee: Secondary | ICD-10-CM | POA: Diagnosis not present

## 2023-12-13 DIAGNOSIS — R6889 Other general symptoms and signs: Secondary | ICD-10-CM | POA: Diagnosis not present

## 2023-12-13 DIAGNOSIS — E162 Hypoglycemia, unspecified: Secondary | ICD-10-CM | POA: Diagnosis not present

## 2023-12-13 DIAGNOSIS — Y92410 Unspecified street and highway as the place of occurrence of the external cause: Secondary | ICD-10-CM | POA: Insufficient documentation

## 2023-12-13 DIAGNOSIS — S80919A Unspecified superficial injury of unspecified knee, initial encounter: Secondary | ICD-10-CM | POA: Diagnosis not present

## 2023-12-13 LAB — CBG MONITORING, ED: Glucose-Capillary: 99 mg/dL (ref 70–99)
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
## 2023-12-13 NOTE — ED Notes (Signed)
 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 States he has only gotten about 3 hours of sleep in the last 72 and is just very sleepy. Pt admits to using xanax for severe anxiety to keep him from buying opiates. The xanax is not prescribed per pt.
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
## 2024-01-13 ENCOUNTER — Encounter (HOSPITAL_BASED_OUTPATIENT_CLINIC_OR_DEPARTMENT_OTHER): Payer: Self-pay

## 2024-01-13 ENCOUNTER — Other Ambulatory Visit: Payer: Self-pay

## 2024-01-13 ENCOUNTER — Emergency Department (HOSPITAL_BASED_OUTPATIENT_CLINIC_OR_DEPARTMENT_OTHER)

## 2024-01-13 ENCOUNTER — Emergency Department (HOSPITAL_BASED_OUTPATIENT_CLINIC_OR_DEPARTMENT_OTHER)
Admission: EM | Admit: 2024-01-13 | Discharge: 2024-01-13 | Disposition: A | Discharge status: Home / Self Care | Attending: Emergency Medicine | Admitting: Emergency Medicine

## 2024-01-13 DIAGNOSIS — R0781 Pleurodynia: Secondary | ICD-10-CM | POA: Insufficient documentation

## 2024-01-13 DIAGNOSIS — R079 Chest pain, unspecified: Secondary | ICD-10-CM | POA: Diagnosis not present

## 2024-01-13 DIAGNOSIS — Y92828 Other wilderness area as the place of occurrence of the external cause: Secondary | ICD-10-CM | POA: Insufficient documentation

## 2024-01-13 DIAGNOSIS — M25511 Pain in right shoulder: Secondary | ICD-10-CM | POA: Diagnosis not present

## 2024-01-13 DIAGNOSIS — S7011XA Contusion of right thigh, initial encounter: Secondary | ICD-10-CM | POA: Diagnosis not present

## 2024-01-13 DIAGNOSIS — S80812A Abrasion, left lower leg, initial encounter: Secondary | ICD-10-CM | POA: Insufficient documentation

## 2024-01-13 DIAGNOSIS — R21 Rash and other nonspecific skin eruption: Secondary | ICD-10-CM

## 2024-01-13 DIAGNOSIS — M25561 Pain in right knee: Secondary | ICD-10-CM | POA: Diagnosis not present

## 2024-01-13 DIAGNOSIS — M79632 Pain in left forearm: Secondary | ICD-10-CM | POA: Insufficient documentation

## 2024-01-13 DIAGNOSIS — M79651 Pain in right thigh: Secondary | ICD-10-CM | POA: Diagnosis not present

## 2024-01-13 MED ORDER — PREDNISONE 20 MG PO TABS: 40.0000 mg | ORAL_TABLET | Freq: Every day | ORAL | 0 refills | Status: DC

## 2024-01-13 MED ORDER — CYCLOBENZAPRINE HCL 10 MG PO TABS: 10.0000 mg | ORAL_TABLET | Freq: Two times a day (BID) | ORAL | 0 refills | Status: DC | PRN

## 2024-01-13 MED ORDER — PREDNISONE 20 MG PO TABS: 40.0000 mg | ORAL_TABLET | Freq: Every day | ORAL | 0 refills | Status: AC

## 2024-01-13 MED ORDER — CYCLOBENZAPRINE HCL 10 MG PO TABS: 10.0000 mg | ORAL_TABLET | Freq: Two times a day (BID) | ORAL | 0 refills | Status: AC | PRN

## 2024-01-13 MED ORDER — PREDNISONE 50 MG PO TABS
60.0000 mg | ORAL_TABLET | Freq: Once | ORAL | Status: AC
  Administered 2024-01-13: 60 mg via ORAL
  Filled 2024-01-13: qty 1

## 2024-01-13 NOTE — Discharge Instructions (Signed)
 Imaging studies were negative for any acute fracture or dislocation.  Because of pain.  Will send him with prednisone  for treatment of contact dermatitis.  Also send you home with a muscle relaxer to use as needed.  This medication can cause drowsiness so please do not drive or perform any high risk activity until you realize its effects on you.
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
## 2024-02-10 DIAGNOSIS — F1120 Opioid dependence, uncomplicated: Secondary | ICD-10-CM | POA: Diagnosis not present

## 2024-02-14 DIAGNOSIS — F1120 Opioid dependence, uncomplicated: Secondary | ICD-10-CM | POA: Diagnosis not present

## 2024-03-06 DIAGNOSIS — F1120 Opioid dependence, uncomplicated: Secondary | ICD-10-CM | POA: Diagnosis not present

## 2024-04-03 DIAGNOSIS — F1120 Opioid dependence, uncomplicated: Secondary | ICD-10-CM | POA: Diagnosis not present

## 2024-04-10 DIAGNOSIS — F1120 Opioid dependence, uncomplicated: Secondary | ICD-10-CM | POA: Diagnosis not present

## 2024-04-24 DIAGNOSIS — F1120 Opioid dependence, uncomplicated: Secondary | ICD-10-CM | POA: Diagnosis not present

## 2024-05-01 DIAGNOSIS — F1120 Opioid dependence, uncomplicated: Secondary | ICD-10-CM | POA: Diagnosis not present

## 2024-05-08 ENCOUNTER — Encounter (HOSPITAL_COMMUNITY): Payer: Self-pay | Admitting: Emergency Medicine

## 2024-05-08 ENCOUNTER — Other Ambulatory Visit: Payer: Self-pay

## 2024-05-08 ENCOUNTER — Emergency Department (HOSPITAL_COMMUNITY)
Admission: EM | Admit: 2024-05-08 | Discharge: 2024-05-09 | Disposition: A | Discharge status: Home / Self Care | Attending: Emergency Medicine | Admitting: Emergency Medicine

## 2024-05-08 DIAGNOSIS — R000 Tachycardia, unspecified: Secondary | ICD-10-CM | POA: Diagnosis not present

## 2024-05-08 DIAGNOSIS — T50904A Poisoning by unspecified drugs, medicaments and biological substances, undetermined, initial encounter: Secondary | ICD-10-CM | POA: Diagnosis not present

## 2024-05-08 DIAGNOSIS — Z79899 Other long term (current) drug therapy: Secondary | ICD-10-CM | POA: Diagnosis not present

## 2024-05-08 DIAGNOSIS — R6889 Other general symptoms and signs: Secondary | ICD-10-CM | POA: Diagnosis not present

## 2024-05-08 DIAGNOSIS — T402X1A Poisoning by other opioids, accidental (unintentional), initial encounter: Secondary | ICD-10-CM | POA: Diagnosis not present

## 2024-05-08 DIAGNOSIS — Z743 Need for continuous supervision: Secondary | ICD-10-CM | POA: Diagnosis not present

## 2024-05-08 DIAGNOSIS — T40601A Poisoning by unspecified narcotics, accidental (unintentional), initial encounter: Secondary | ICD-10-CM | POA: Insufficient documentation

## 2024-05-08 DIAGNOSIS — I499 Cardiac arrhythmia, unspecified: Secondary | ICD-10-CM | POA: Diagnosis not present

## 2024-05-08 DIAGNOSIS — F1120 Opioid dependence, uncomplicated: Secondary | ICD-10-CM | POA: Diagnosis not present

## 2024-05-08 LAB — COMPREHENSIVE METABOLIC PANEL WITH GFR
ALT: 19 U/L (ref 0–44)
AST: 32 U/L (ref 15–41)
Albumin: 3.1 g/dL — ABNORMAL LOW (ref 3.5–5.0)
Alkaline Phosphatase: 72 U/L (ref 38–126)
Anion gap: 12 (ref 5–15)
BUN: 12 mg/dL (ref 6–20)
CO2: 26 mmol/L (ref 22–32)
Calcium: 8.5 mg/dL — ABNORMAL LOW (ref 8.9–10.3)
Chloride: 104 mmol/L (ref 98–111)
Creatinine, Ser: 0.95 mg/dL (ref 0.61–1.24)
GFR, Estimated: >60 mL/min (ref >60)
Glucose, Bld: 93 mg/dL (ref 70–99)
Potassium: 3.7 mmol/L (ref 3.5–5.1)
Sodium: 142 mmol/L (ref 135–145)
Total Bilirubin: 0.3 mg/dL (ref 0.0–1.2)
Total Protein: 5.5 g/dL — ABNORMAL LOW (ref 6.5–8.1)

## 2024-05-08 LAB — CBC WITH DIFFERENTIAL/PLATELET
Abs Immature Granulocytes: 0.03 K/uL (ref 0.00–0.07)
Basophils Absolute: 0 K/uL (ref 0.0–0.1)
Basophils Relative: 0 %
Eosinophils Absolute: 0.2 K/uL (ref 0.0–0.5)
Eosinophils Relative: 2 %
HCT: 38.7 % — ABNORMAL LOW (ref 39.0–52.0)
Hemoglobin: 12.3 g/dL — ABNORMAL LOW (ref 13.0–17.0)
Immature Granulocytes: 0 %
Lymphocytes Relative: 12 %
Lymphs Abs: 1.4 K/uL (ref 0.7–4.0)
MCH: 26.6 pg (ref 26.0–34.0)
MCHC: 31.8 g/dL (ref 30.0–36.0)
MCV: 83.8 fL (ref 80.0–100.0)
Monocytes Absolute: 0.9 K/uL (ref 0.1–1.0)
Monocytes Relative: 8 %
Neutro Abs: 9 K/uL — ABNORMAL HIGH (ref 1.7–7.7)
Neutrophils Relative %: 78 %
Platelets: 266 K/uL (ref 150–400)
RBC: 4.62 MIL/uL (ref 4.22–5.81)
RDW: 14.1 % (ref 11.5–15.5)
WBC: 11.5 K/uL — ABNORMAL HIGH (ref 4.0–10.5)
nRBC: 0 % (ref 0.0–0.2)

## 2024-05-08 MED ORDER — LACTATED RINGERS IV BOLUS
1000.0000 mL | Freq: Once | INTRAVENOUS | Status: AC
  Administered 2024-05-08: 1000 mL via INTRAVENOUS

## 2024-05-08 NOTE — ED Provider Notes (Signed)
 22 yo male with fentanyl OD. Given narcan with EMS (nasal and IV around 8PM). Arrived alert and oriented.  Around 9:30pm, patient ingested pills that were in his pocket. Now diaphoretic.   Physical Exam  BP 133/88   Pulse 81   Temp 97.6 F (36.4 C)   Resp 17   Ht 5' 9 (1.753 m)   Wt 87.5 kg   SpO2 98%   BMI 28.50 kg/m   Physical Exam  Procedures  Procedures  ED Course / MDM    Medical Decision Making Amount and/or Complexity of Data Reviewed Labs: ordered.   11:00pm Patient rouses to verbal stimuli, denies taking any pills tonight while in the ER (this was witnessed by staff). States he hasn't done any drugs since yesterday. States the roof of his mouth is dry and that is why he pauses while talking. Patient falling asleep while talking. Maintaining airway, vitals stable. Will continue to monitor.   Patient ambulatory with a steady gait, drinking soda, requesting dc. Plan is to dc with ride. Provided with resources at dc.        [name], PA-C 05/09/24 9542    [name], April, MD 05/09/24 MANUS

## 2024-05-08 NOTE — ED Notes (Signed)
 Assisted pt to stand at bedside to urinate with urinal. Pt acting bizarre and and this RN witnessed a clear plastic baggie in pants pocket. Asked pt to remove belongings from pockets and pt became agitated an removed items but hid them under his leg. When asked what he was hiding, pt refused to move leg or cooperate. Security at bedside to assist and pt still uncooperative and appeared to have something in his hands but refused to allow security to look. He them appeared to put something in his mouth. Provider aware and at bedside. Belongings taken and secured at desk and pt placed in gown and assisted back to bed on cardiac monitor. Pt denies taking anything by mouth but is much more sedated.
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
## 2024-05-08 NOTE — ED Notes (Signed)
 Upon arrival to pt's room to reattach cardiac leads, pt found to be sleeping and extremely diaphoretic. Pt easily aroused but returns to sleeping state. Provider aware and orders placed.

## 2024-05-08 NOTE — ED Provider Notes (Signed)
 [place] [facility] Provider Note  MDM   HPI/ROS:  Eric Reid is a 23 y.o. male with a PMH of polysubstance use who presented for drug overdose.  Patient arrived via EMS after he was found unresponsive in his car.  Patient reports that he used fentanyl powder today, intranasal Narcan was given by fire department with some improvement and 1 mg IV Narcan given by EMS prior to arrival with patient now arriving HDS, alert and oriented x 4.  On my initial evaluation, patient is:  -Vital signs stable. Patient afebrile, hemodynamically stable, and non-toxic appearing.  - Alert and oriented male in no acute distress, able to follow all commands appropriately.  He is able to communicate what happened, states that he has been off of fentanyl and opiates and on Suboxone.  He states that he got involved with the wrong crowd.  and picked up some fentanyl powder and does not remember passing out.  Patient denies any SI/HI/VH/AH.  He states that the intent of him taking the fentanyl was to get high. EKG on arrival Sinus tachycardia at a rate of 103, intervals all WNL, no ST elevations or depressions that are concerning for acute ischemia.  Plan for 2-hour post Narcan observation.  At approximately 9 PM, patient was fumbling with a reported pill bottle seen by nursing, nursing called security for patient then put whenever within the lateral within his mouth.  Plan for continued monitoring.  Patient states that there was nothing in the pill bottle (a pill bottle is empty and is labeled for Suboxone).  Patient continues to maintain his airway, is alert and oriented x 4, no excessive drowsiness or sleepiness, no signs of opioid intoxication.  Plan for outpatient monitor, repeat EKG, continued observation as well as UDS and basic labs.  Interpretations, interventions, and the patient's course of care are documented below.    Disposition:  Disposition is likely admission.   Handoff was given in the 20 signout to the ED,.  Pending lab results department reassessment with plan to to get in with plan for relation  Clinical Impression:  1. Opiate overdose, accidental or unintentional, initial encounter [facility])     Clinical Complexity A medically appropriate history, review of systems, and physical exam was performed.  My independent interpretations of EKG, labs, and radiology are documented in the ED course above.   If decision rules were used in this patient's evaluation, they are listed below.   Click here for ABCD2, HEART and other calculatorsREFRESH Note before signing   Patient's presentation is most consistent with acute complicated illness / injury requiring diagnostic workup.  Medical Decision Making Amount and/or Complexity of Data Reviewed Labs: ordered.    HPI/ROS      See MDM section for pertinent HPI and ROS. A complete ROS was performed with pertinent positives/negatives noted above.   History reviewed. No pertinent past medical history.  History reviewed. No pertinent surgical history.    Physical Exam   Vitals:   05/08/24 2021 05/08/24 2022 05/08/24 2030  BP:   131/78  Pulse:   (!) 113  Resp:   (!) 36  Temp: 97.8 F (36.6 C)    TempSrc: Oral    SpO2:   99%  Weight:  87.5 kg   Height:  5' 9 (1.753 m)     Physical Exam Vitals and nursing note reviewed.  Constitutional:      General: He is not in acute distress.    Appearance:  He is well-developed.  HENT:     Head: Normocephalic and atraumatic.  Eyes:     Conjunctiva/sclera: Conjunctivae normal.  Cardiovascular:     Rate and Rhythm: Normal rate and regular rhythm.     Heart sounds: No murmur heard. Pulmonary:     Effort: Pulmonary effort is normal. No respiratory distress.     Breath sounds: Normal breath sounds.  Abdominal:     Palpations: Abdomen is soft.     Tenderness: There is no abdominal tenderness.  Musculoskeletal:        General: No swelling.      Cervical back: Neck supple.  Skin:    General: Skin is warm and dry.     Capillary Refill: Capillary refill takes less than 2 seconds.  Neurological:     Mental Status: He is alert.  Psychiatric:        Mood and Affect: Mood normal.      Procedures   If procedures were preformed on this patient, they are listed below:  Procedures  Please note that this documentation was produced with the assistance of voice-to-text technology and may contain errors.     Billy Pal, MD 05/08/24 [id number]    Charlyn Sora, MD 05/09/24 [id number]

## 2024-05-09 LAB — RAPID URINE DRUG SCREEN, HOSP PERFORMED
Amphetamines: NOT DETECTED
Barbiturates: NOT DETECTED
Benzodiazepines: POSITIVE — AB
Cocaine: NOT DETECTED
Opiates: NOT DETECTED
Tetrahydrocannabinol: POSITIVE — AB

## 2024-05-09 LAB — SALICYLATE LEVEL: Salicylate Lvl: <7 mg/dL — ABNORMAL LOW (ref 7.0–30.0)

## 2024-05-09 LAB — ACETAMINOPHEN LEVEL: Acetaminophen (Tylenol), Serum: <10 ug/mL — ABNORMAL LOW (ref 10–30)

## 2024-05-09 NOTE — ED Notes (Signed)
 Provided pt with graham crackers and ginger ale; will ambulate pt shortly

## 2024-05-09 NOTE — ED Notes (Addendum)
 ISTAT entered in error. Provider aware

## 2024-05-09 NOTE — ED Notes (Signed)
 Pt on phone with mother, who will provide discharge transportation. She will come inside per provider request and then pt will be discharged

## 2024-05-09 NOTE — ED Notes (Signed)
 Ambulated pt, pt had steady gait and no complaints

## 2024-05-10 LAB — I-STAT CHEM 8, ED
BUN: 22 mg/dL — ABNORMAL HIGH (ref 6–20)
Calcium, Ion: 1.14 mmol/L — ABNORMAL LOW (ref 1.15–1.40)
Chloride: 105 mmol/L (ref 98–111)
Creatinine, Ser: 1.2 mg/dL (ref 0.61–1.24)
Glucose, Bld: 151 mg/dL — ABNORMAL HIGH (ref 70–99)
HCT: 40 % (ref 39.0–52.0)
Hemoglobin: 13.6 g/dL (ref 13.0–17.0)
Potassium: 3.6 mmol/L (ref 3.5–5.1)
Sodium: 138 mmol/L (ref 135–145)
TCO2: 21 mmol/L — ABNORMAL LOW (ref 22–32)

## 2024-05-17 LAB — FENTANYL AND METABOLITE
Fentanyl: 3.1 ng/mL — ABNORMAL HIGH (ref 1.0–3.0)
Norfentanyl: 1.2 ng/mL
